# Patient Record
Sex: Female | Born: 1970 | Race: White | Hispanic: No | Marital: Married | Smoking: Never smoker
Health system: Southern US, Community
[De-identification: ages and names within clinical notes are randomized; demographics above are authoritative.]

---

## 2000-01-17 ENCOUNTER — Other Ambulatory Visit: Admission: RE | Admit: 2000-01-17 | Discharge: 2000-01-17 | Payer: Self-pay | Admitting: *Deleted

## 2000-02-03 ENCOUNTER — Other Ambulatory Visit: Admission: RE | Admit: 2000-02-03 | Discharge: 2000-02-03 | Payer: Self-pay | Admitting: *Deleted

## 2000-02-03 ENCOUNTER — Encounter (INDEPENDENT_AMBULATORY_CARE_PROVIDER_SITE_OTHER): Payer: Self-pay

## 2001-01-15 ENCOUNTER — Other Ambulatory Visit: Admission: RE | Admit: 2001-01-15 | Discharge: 2001-01-15 | Payer: Self-pay | Admitting: *Deleted

## 2006-12-18 ENCOUNTER — Ambulatory Visit (HOSPITAL_COMMUNITY): Admission: RE | Admit: 2006-12-18 | Discharge: 2006-12-19 | Payer: Self-pay | Admitting: Neurosurgery

## 2007-10-01 IMAGING — CR DG CERVICAL SPINE 2 OR 3 VIEWS
1 series · 1 of 1 positions shown · non-contrast
Comparison: none

CLINICAL DATA: C3 through C5 ACDF.
 PORTABLE LATERAL CERVICAL SPINE ? 3 VIEWS ? 12/18/06:

[view not recorded]
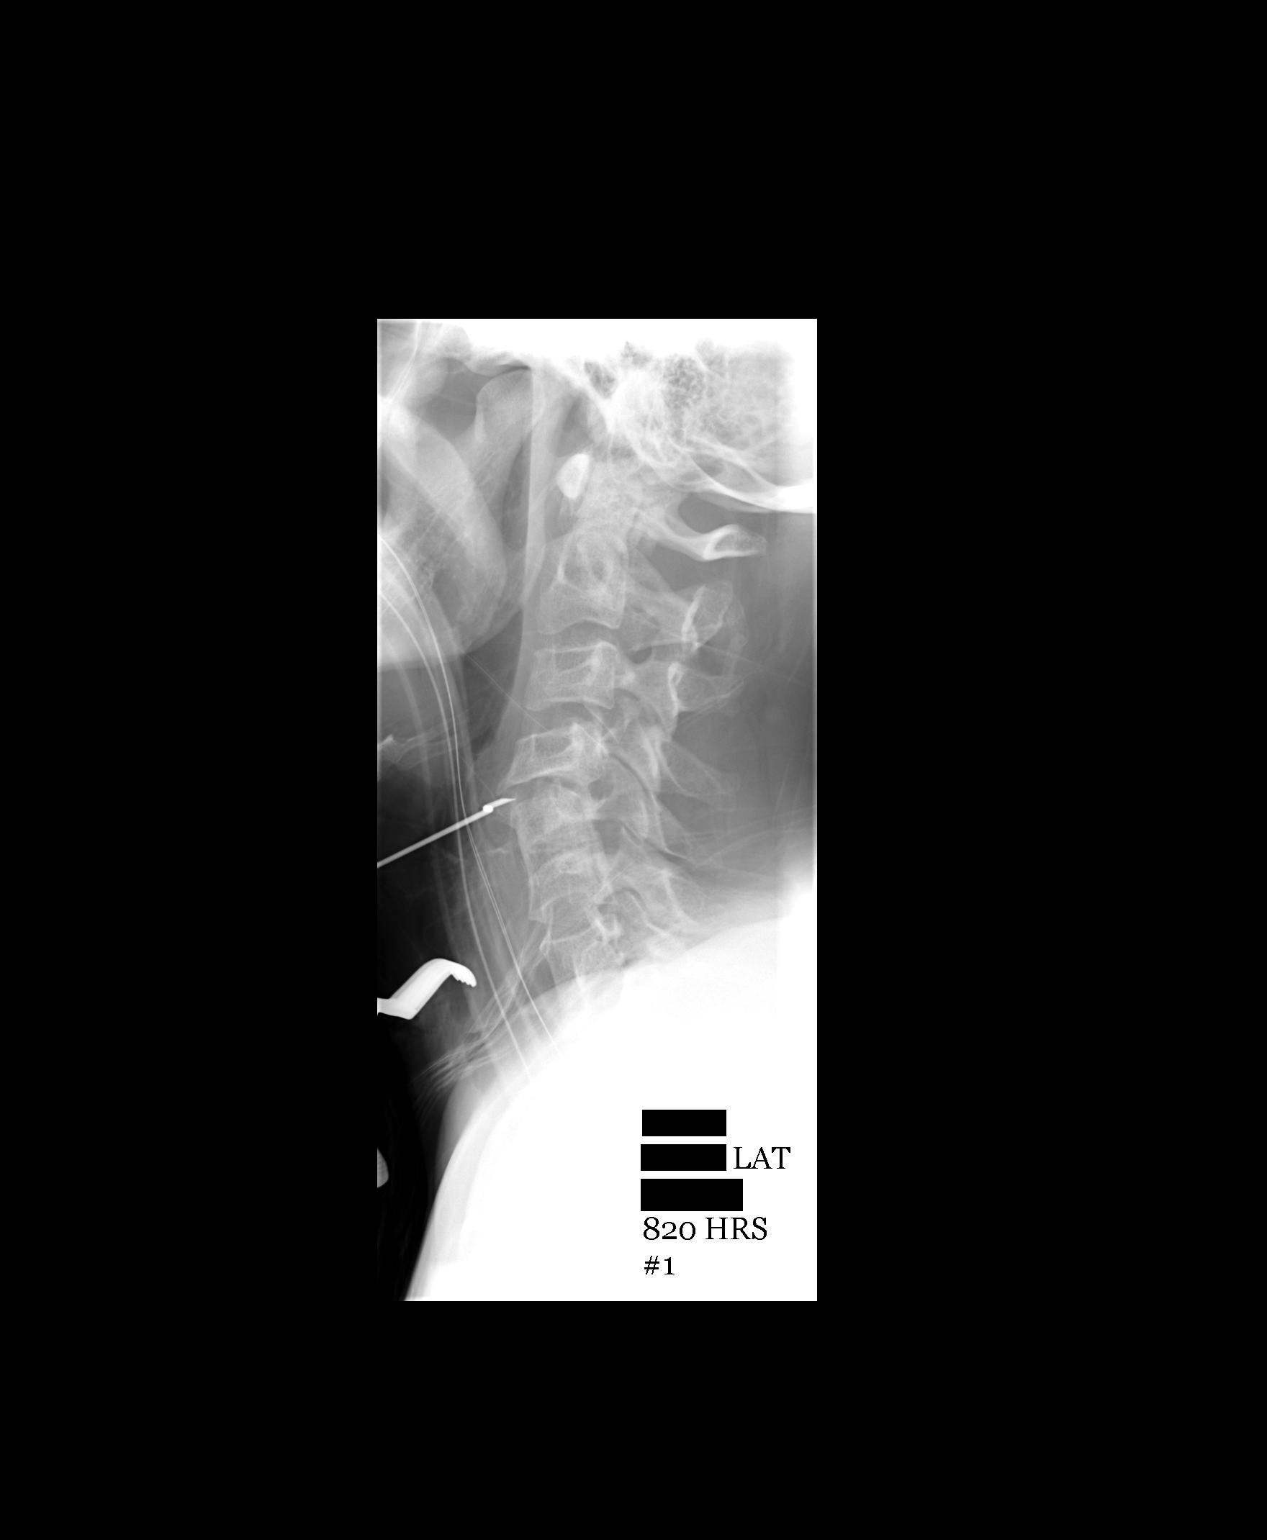

[1 of 1 positions shown; findings below may reference images not displayed]

FINDINGS: Film #1 reveals a needle at the C4-5 interspace.  Question of prior bony fusion at C5-6.  There is mild retrolisthesis of C4 on C5.
 Image #2 reveals anterior plate and interbody spacers at C3-4 and C4-5 in good position.  Again there appears to be prior fusion at C5-6.  
 Image #3 at 5277 hours reveals anterior plate and interbody spacers at C3-4 and C4-5 in good position.  No complications are identified.
IMPRESSION: Anterior plate and interbody fusion of C3, C4, and C5.  Question prior bony interbody fusion at C5-6.

## 2010-12-21 NOTE — Op Note (Signed)
Casey Terry, MARSEILLE NO.:  [id number]   MEDICAL RECORD NO.:  [id number]          PATIENT TYPE:  OIB   LOCATION:  3013                         FACILITY:  [facility]   PHYSICIAN:  Cristi Loron, M.D.DATE OF BIRTH:  1970-11-29   DATE OF PROCEDURE:  12/18/2006  DATE OF DISCHARGE:  12/19/2006                               OPERATIVE REPORT   BRIEF HISTORY:  The patient is a 40 year old white female who has  suffered from neck and arm pain.  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  I discussed the various  treatment options with the patient including surgery.  The patient has  weighed the risks, benefits and alternatives surgery, decided to proceed  with a C3-4 and 4-5 anterior cervical diskectomy fusion plating.   PREOPERATIVE DIAGNOSIS:  C3-4 and C4-5 herniated nucleus pulposus,  spondylosis, stenosis, cervical myelopathy, radiculopathy, cervicalgia.   POSTOPERATIVE DIAGNOSIS:  C3-4 and C4-5 herniated nucleus pulposus,  spondylosis, stenosis, cervical myelopathy, radiculopathy, cervicalgia.   PROCEDURE:  C3-4 and C4-5 extensive anterior cervical diskectomy and  decompression; insertion of C3-4 and C4-5 interbody prosthesis; C3-4 and  C4-5 interbody autograft arthrodesis; anterior cervical plating C3-4 and  4-5 with Codman slim lock titanium plate and screws.   SURGEON:  Dr. Delma Officer.   ASSISTANT:  Dr. Hilda Lias.   ANESTHESIA:  General endotracheal.   ESTIMATED BLOOD LOSS:  100 mL.   SPECIMENS:  None.   DRAINS:  None.   COMPLICATIONS:  None.   PROCEDURE:  The patient was brought to the operating room by anesthesia  team, general endotracheal anesthesia was induced.  The patient remained  in supine position.  A roll was placed under her shoulders placing neck  in  slight extension.  Her anterior cervical region was then prepared  with Betadine scrub and Betadine solution.  Sterile drapes were applied.  I then injected area be incised with Marcaine with epinephrine solution.  The scalpel to make a transverse incision in patient's left anterior  neck.  I used a Metzenbaum scissors to divide the platysma muscle and  then to dissect medial to the sternocleidomastoid muscle jugular vein  and carotid artery.  I carefully dissected down towards the anterior  cervical spine identifying the esophagus and retracting it medially.  I  then used Kitner swabs to clear soft tissue from the anterior cervical  spine exposing the intervertebral disk space.  I inserted a bent spinal  needle into the exposed intervertebral disk space.  We then obtained  intraoperative radiograph to confirm our location.   We then used electrocautery to detach the medial border of the longus  colli muscle bilaterally from C3-4, C4-5 intervertebral disk space.  We  inserted the Caspar self-retaining retractor underneath the longus colli  muscle bilaterally and to provide exposure.  We began at C3-4 and  incised the C3-4 intervertebral disk with a 15 scalpel and performed a  partial intervertebral dissection with  the pituitary forceps.  We then  inserted distraction screws C3 and C4 distracted interspace and then  used a high-speed drill to decorticate the vertebral endplates at C3-4  drill away the remainder of C3-4 intervertebral disk to drill away some  posterior spondylosis and to thin out the posterior longitudinal  ligament.  I then incised the ligament with the arachnoid knife and  removed with a Kerrison punch undercutting the vertebral endplates at C3-  4 decompressing the thecal sac.  We then performed foraminotomies about  the bilateral C4 nerve root completing the decompression at this level.   We then repeated this procedure in analogous fashion at C4-5  decompressing the  C4-5 thecal sac as well as a bilateral C5 nerve roots.  I should mention we did this decompression under the magnification and  illumination of the microscope.   Having completed decompression we now turned attention to the  arthrodesis and insertion of prosthesis.  We used a trial spacers and  determined to use a 5 mm small Alphatec PEEK interbody spacer.  We  prefilled the spacer with combination of local autograft bone we  obtained during the decompression and Vitoss bone graft extender.  We  inserted the filled prosthesis in distracted C4-5 and C3-4 interspace.  We then removed distraction screws.  There was a good snug fit of the  prosthesis at both levels.   We now turned attention to the anterior spinal fixation.  We used a high-  speed drill to drill away some ventral spondylosis at C3-4, C4-5 so that  the plate would lie down flat.  We selected appropriate length Codman  slim lock anterior cervical plate and laid it along the anterior aspect  of the vertebral bodies from C3-C5.  We then used a drill to drill two  12 mm holes at C3, 4, and 5.  We then secured the plate to the vertebral  bodies by placing two 12 mm self-tapping screws at C3, 4, and 5.  We  then obtained intraoperative radiograph to confirm our instrumentation.  I thought that the prosthesis at C3-4 was minimally deep, I therefore  removed the screws in the plate and then distracted C3-4 interspace  again and then pulled the prosthesis back a few mm.  We then replaced  the plate screws and then obtained another intraoperative radiograph  that demonstrated good position of the plate screws interbody  prosthesis.  I therefore secured the screws to the plate by locking each  cam.  We then irrigated the wound out with bacitracin solution and  obtained hemostasis using bipolar electrocautery.  We then removed the  retractor and then inspected esophagus for any damage, there was none apparent.  We then reapproximated  the patient's platysma muscle with  interrupted 3-0 Vicryl suture, subcutaneous tissue with interrupted 3-0  Vicryl suture and skin with Steri-Strips and Benzoin.  The wound was  then coated with bacitracin ointment.  A sterile dressing applied.  The  drapes were removed and the patient was subsequently extubated by  anesthesia team and transported to post anesthesia care unit in stable  condition.  All sponge, instrument and needle counts correct at end this  case.      Cristi Loron, M.D.  Electronically Signed     JDJ/MEDQ  D:  12/19/2006  T:  12/20/2006  Job:  [id number]

## 2015-12-10 ENCOUNTER — Telehealth: Payer: Self-pay | Admitting: Infectious Diseases

## 2016-03-08 ENCOUNTER — Ambulatory Visit: Payer: Self-pay | Admitting: Internal Medicine

## 2016-04-19 ENCOUNTER — Ambulatory Visit (INDEPENDENT_AMBULATORY_CARE_PROVIDER_SITE_OTHER): Payer: PRIVATE HEALTH INSURANCE | Admitting: Internal Medicine

## 2016-04-19 ENCOUNTER — Encounter: Payer: Self-pay | Admitting: Internal Medicine

## 2016-04-19 VITALS — BP 132/74 | HR 77 | Temp 97.2°F | Wt 164.0 lb

## 2016-04-19 DIAGNOSIS — K120 Recurrent oral aphthae: Secondary | ICD-10-CM

## 2016-04-19 MED ORDER — MAGIC MOUTHWASH W/LIDOCAINE: 5.0000 mL | Freq: Three times a day (TID) | ORAL | 0 refills | Status: AC | PRN

## 2016-04-19 NOTE — Patient Instructions (Signed)
Please ask your dentist to refer you to periodontist/[employer]

## 2016-04-19 NOTE — Progress Notes (Signed)
    RFV: recurrent stomatitis x 2 years  Patient ID: Casey Terry, female   DOB: [birth year], 45 y.o.   MRN: [id number]  HPI Archie Pattenonya is a 45yo F with recurrent mouth sores and increased dental caries over the last 2 years. 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 She also noticed some of these ulcers appear by her gum line. They would last roughly 1-2 wk then disappear, wax and wane.   She does not smoke, did chew gum-> went to sugarless gum, does drink sodas.  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 No hx of biopsy  Outpatient Encounter Prescriptions as of 04/19/2016  Medication Sig  . cetirizine (ZYRTEC) 10 MG tablet Take 10 mg by mouth daily.  . colchicine 0.6 MG tablet Take 0.6 mg by mouth 2 (two) times daily.  . ferrous sulfate 325 (65 FE) MG tablet Take 325 mg by mouth daily with breakfast.  . furosemide (LASIX) 40 MG tablet Take 40 mg by mouth daily.  . montelukast (SINGULAIR) 10 MG tablet Take 10 mg by mouth at bedtime.   No facility-administered encounter medications on file as of 04/19/2016.      There are no active problems to display for this patient.    Health Maintenance Due  Topic Date Due  . HIV Screening  12/03/1983  . TETANUS/TDAP  12/03/1987  . PAP SMEAR  12/02/1989  . INFLUENZA VACCINE  03/08/2016    Soc hx: no smoking.   Family hx: no history of oral cancers  Review of Systems No fever, chills, nightsweats. Physical Exam   BP 132/74   Pulse 77   Temp 97.2 F (36.2 C) (Oral)   Wt 164 lb (74.4 kg)   LMP 04/11/2016  (Approximate)   Physical Exam  Constitutional:  oriented to person, place, and time. appears well-developed and well-nourished. No distress.  HENT: [place]/AT, PERRLA, no scleral icterus Mouth/Throat: Oropharynx is clear and moist. No oropharyngeal exudate.  Numerous dental caries filled with metal. Dental caries noted by gum line. Left lower gum line slight inflammation Skin: Skin is warm and dry. No rash noted. No erythema.  Psychiatric: a normal mood and affect.  behavior is normal.    Assessment and Plan    Some photos appear to be c/w aphthous ulcers though a few appears herpetic. 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 Would recommend that she sees periodontist for evaluation, possible biopsy.

## 2016-04-20 LAB — CBC WITH DIFFERENTIAL/PLATELET
BASOS ABS: 0 {cells}/uL (ref 0–200)
Basophils Relative: 0 %
EOS PCT: 1 %
Eosinophils Absolute: 70 cells/uL (ref 15–500)
HEMATOCRIT: 36.1 % (ref 35.0–45.0)
HEMOGLOBIN: 11.5 g/dL — AB (ref 11.7–15.5)
LYMPHS ABS: 980 {cells}/uL (ref 850–3900)
Lymphocytes Relative: 14 %
MCH: 25.9 pg — ABNORMAL LOW (ref 27.0–33.0)
MCHC: 31.9 g/dL — ABNORMAL LOW (ref 32.0–36.0)
MCV: 81.3 fL (ref 80.0–100.0)
MONO ABS: 490 {cells}/uL (ref 200–950)
MPV: 10.2 fL (ref 7.5–12.5)
Monocytes Relative: 7 %
NEUTROS ABS: 5460 {cells}/uL (ref 1500–7800)
Neutrophils Relative %: 78 %
Platelets: 206 10*3/uL (ref 140–400)
RBC: 4.44 MIL/uL (ref 3.80–5.10)
RDW: 14.3 % (ref 11.0–15.0)
WBC: 7 10*3/uL (ref 3.8–10.8)

## 2016-04-20 LAB — HIV ANTIBODY (ROUTINE TESTING W REFLEX): HIV: NONREACTIVE

## 2016-04-20 LAB — HSV 1 ANTIBODY, IGG

## 2016-04-20 LAB — HEPATITIS C ANTIBODY: HCV AB: NEGATIVE

## 2016-04-20 LAB — HSV 2 ANTIBODY, IGG: HSV 2 Glycoprotein G Ab, IgG: 7.24 Index — ABNORMAL HIGH (ref <0.90)

## 2017-01-10 NOTE — Telephone Encounter (Signed)
Read by Claris CheMargaret
